# Patient Record
Sex: Female | Born: 1995 | Race: White | Hispanic: No | Marital: Single | Smoking: Never smoker
Health system: Southern US, Community
[De-identification: ages and names within clinical notes are randomized; demographics above are authoritative.]

## PROBLEM LIST (undated history)

## (undated) DIAGNOSIS — K5792 Diverticulitis of intestine, part unspecified, without perforation or abscess without bleeding: Secondary | ICD-10-CM

## (undated) HISTORY — PX: WISDOM TOOTH EXTRACTION: SHX21

## (undated) HISTORY — PX: TONSILLECTOMY: SUR1361

---

## 2017-03-17 ENCOUNTER — Emergency Department (HOSPITAL_COMMUNITY)
Admission: EM | Admit: 2017-03-17 | Discharge: 2017-03-17 | Disposition: A | Discharge status: Home / Self Care | Payer: Medicaid Other | Attending: Emergency Medicine | Admitting: Emergency Medicine

## 2017-03-17 ENCOUNTER — Emergency Department (HOSPITAL_COMMUNITY): Payer: Medicaid Other

## 2017-03-17 ENCOUNTER — Encounter (HOSPITAL_COMMUNITY): Payer: Self-pay | Admitting: *Deleted

## 2017-03-17 DIAGNOSIS — Z79899 Other long term (current) drug therapy: Secondary | ICD-10-CM | POA: Diagnosis not present

## 2017-03-17 DIAGNOSIS — R1032 Left lower quadrant pain: Secondary | ICD-10-CM | POA: Diagnosis present

## 2017-03-17 DIAGNOSIS — N12 Tubulo-interstitial nephritis, not specified as acute or chronic: Secondary | ICD-10-CM | POA: Diagnosis not present

## 2017-03-17 DIAGNOSIS — K922 Gastrointestinal hemorrhage, unspecified: Secondary | ICD-10-CM | POA: Diagnosis not present

## 2017-03-17 HISTORY — DX: Diverticulitis of intestine, part unspecified, without perforation or abscess without bleeding: K57.92

## 2017-03-17 LAB — COMPREHENSIVE METABOLIC PANEL
ALK PHOS: 72 U/L (ref 38–126)
ALT: 14 U/L (ref 14–54)
ANION GAP: 9 (ref 5–15)
AST: 27 U/L (ref 15–41)
Albumin: 4.9 g/dL (ref 3.5–5.0)
BILIRUBIN TOTAL: 0.9 mg/dL (ref 0.3–1.2)
BUN: 19 mg/dL (ref 6–20)
CALCIUM: 9.4 mg/dL (ref 8.9–10.3)
CO2: 26 mmol/L (ref 22–32)
CREATININE: 1.95 mg/dL — AB (ref 0.44–1.00)
Chloride: 103 mmol/L (ref 101–111)
GFR calc non Af Amer: 35 mL/min — ABNORMAL LOW (ref >60)
GFR, EST AFRICAN AMERICAN: 41 mL/min — AB (ref >60)
Glucose, Bld: 88 mg/dL (ref 65–99)
Potassium: 3.8 mmol/L (ref 3.5–5.1)
Sodium: 138 mmol/L (ref 135–145)
TOTAL PROTEIN: 8.3 g/dL — AB (ref 6.5–8.1)

## 2017-03-17 LAB — CBC
HCT: 41 % (ref 36.0–46.0)
HEMOGLOBIN: 13.9 g/dL (ref 12.0–15.0)
MCH: 29.1 pg (ref 26.0–34.0)
MCHC: 33.9 g/dL (ref 30.0–36.0)
MCV: 86 fL (ref 78.0–100.0)
PLATELETS: 311 10*3/uL (ref 150–400)
RBC: 4.77 MIL/uL (ref 3.87–5.11)
RDW: 13.2 % (ref 11.5–15.5)
WBC: 13.4 10*3/uL — ABNORMAL HIGH (ref 4.0–10.5)

## 2017-03-17 LAB — URINALYSIS, ROUTINE W REFLEX MICROSCOPIC
BILIRUBIN URINE: NEGATIVE
Glucose, UA: NEGATIVE mg/dL
Hgb urine dipstick: NEGATIVE
Ketones, ur: NEGATIVE mg/dL
Leukocytes, UA: NEGATIVE
NITRITE: NEGATIVE
PROTEIN: NEGATIVE mg/dL
SPECIFIC GRAVITY, URINE: 1.01 (ref 1.005–1.030)
pH: 5 (ref 5.0–8.0)

## 2017-03-17 LAB — PREGNANCY, URINE: PREG TEST UR: NEGATIVE

## 2017-03-17 LAB — LIPASE, BLOOD: Lipase: 22 U/L (ref 11–51)

## 2017-03-17 MED ORDER — SODIUM CHLORIDE 0.9 % IV BOLUS (SEPSIS)
500.0000 mL | Freq: Once | INTRAVENOUS | Status: AC
  Administered 2017-03-17: 500 mL via INTRAVENOUS

## 2017-03-17 MED ORDER — IOPAMIDOL (ISOVUE-300) INJECTION 61%
100.0000 mL | Freq: Once | INTRAVENOUS | Status: AC | PRN
  Administered 2017-03-17: 80 mL via INTRAVENOUS

## 2017-03-17 MED ORDER — ONDANSETRON HCL 4 MG/2ML IJ SOLN
4.0000 mg | Freq: Once | INTRAMUSCULAR | Status: AC
  Administered 2017-03-17: 4 mg via INTRAVENOUS
  Filled 2017-03-17: qty 2

## 2017-03-17 NOTE — Discharge Instructions (Signed)
You may have an interstitial nephritis. Stop the ciprofloxacin. After creatinine rechecked in a few days. If it is increasingly may need to see nephrology. Follow up with gastroenterology for the GI bleeding.

## 2017-03-17 NOTE — ED Triage Notes (Signed)
Patient is alert and oriented x4.  She is being seen for vomiting blood along with rectal bleeding.  Patient was seen two weeks ago and Dx with diverticulitis and as of today started vomiting blood.  Additionally the patient reports abdominal distension and lower back pain currently rates 6 of 10.

## 2017-03-17 NOTE — ED Provider Notes (Signed)
[facility]-EMERGENCY DEPT Provider Note   CSN: [id number] Arrival date & time: 03/17/17  1032     History   Chief Complaint Chief Complaint  Patient presents with  . Back Pain  . Hematemesis    HPI Mariah Christensen is a 21 y.o. female.  HPI Patient presents with lower abdominal and back pain. Set up for the last 2 weeks. Also blood in the stool. States she was seen at person [facility] around 2 weeks ago. States blood work was done and a rectal exam was done. States that rectal exam showed blood. States she was told to follow-up with her primary care doctor and may be GI. CT scan was done. States she followed up with her primary care doctor who told her that they should've been able to figure out what was going on with the blood work. States that she was told that she had diverticulitis and was started on an antibiotic. States she thinks it started with a see. She still been taking it. States that she's had continued bleeding and continued pain. Pain is in her low back and left lower abdomen. No vaginal bleeding or discharge. Stool is a little looser than normal but not diarrhea. Has had red blood with it. Also said 2 episodes of vomiting states was a little blood in the emesis. Patient said she has always had some problems with diarrhea. Past Medical History:  Diagnosis Date  . Diverticulitis     There are no active problems to display for this patient.   Past Surgical History:  Procedure Laterality Date  . WISDOM TOOTH EXTRACTION      OB History    No data available       Home Medications    Prior to Admission medications   Medication Sig Start Date End Date Taking? Authorizing Provider  amphetamine-dextroamphetamine (ADDERALL) 20 MG tablet Take 20 mg by mouth 2 (two) times daily. 01/25/17  Yes [provider]  escitalopram (LEXAPRO) 20 MG tablet Take 20 mg by mouth daily. 02/13/17  Yes [provider]  ibuprofen (ADVIL,MOTRIN) 200 MG tablet Take 200 mg by  mouth every 6 (six) hours as needed for moderate pain.   Yes [provider]  omeprazole (PRILOSEC) 20 MG capsule Take 20 mg by mouth daily. 02/13/17  Yes [provider]  temazepam (RESTORIL) 15 MG capsule take 1 capsule by mouth at bedtime if needed for sleep 12/28/16  Yes [provider]    Family History History reviewed. No pertinent family history.  Social History Social History  Substance Use Topics  . Smoking status: Never Smoker  . Smokeless tobacco: Never Used  . Alcohol use Yes     Allergies   Amoxapine and related   Review of Systems Review of Systems  Constitutional: Positive for appetite change.  HENT: Negative for congestion.   Respiratory: Negative for shortness of breath.   Cardiovascular: Negative for chest pain.  Gastrointestinal: Positive for abdominal pain, blood in stool, nausea and vomiting. Negative for abdominal distention.  Genitourinary: Negative for dysuria and flank pain.  Musculoskeletal: Positive for back pain.  Neurological: Negative for numbness.  Hematological: Negative for adenopathy.  Psychiatric/Behavioral: Negative for confusion.     Physical Exam Updated Vital Signs BP 124/81 Comment: Resp 16  Pulse 90 Comment: Resp 16  Temp 98.5 F (36.9 C) (Oral)   Resp 16   Ht  (1.499 m)   Wt 61.2 kg (135 lb)   LMP 02/14/2017 Comment: negative urine pregnancy  test 03-17-2017  SpO2 100% Comment: Resp 16  BMI 27.27 kg/m   Physical Exam  Constitutional: She appears well-developed.  HENT:  Head: Atraumatic.  Neck: Neck supple.  Cardiovascular: Normal rate.   Pulmonary/Chest: Effort normal.  Abdominal: There is tenderness.  Mild suprapubic to left lower quadrant tenderness. No rebound or guarding.  Musculoskeletal: She exhibits no edema.  Neurological: She is alert.  Skin: Capillary refill takes less than 2 seconds.  Psychiatric: She has a normal mood and affect.     ED Treatments / Results  Labs (all  labs ordered are listed, but only abnormal results are displayed) Labs Reviewed  COMPREHENSIVE METABOLIC PANEL - Abnormal; Notable for the following:       Result Value   Creatinine, Ser 1.95 (*)    Total Protein 8.3 (*)    GFR calc non Af Amer 35 (*)    GFR calc Af Amer 41 (*)    All other components within normal limits  CBC - Abnormal; Notable for the following:    WBC 13.4 (*)    All other components within normal limits  URINALYSIS, ROUTINE W REFLEX MICROSCOPIC - Abnormal; Notable for the following:    Color, Urine STRAW (*)    All other components within normal limits  LIPASE, BLOOD  PREGNANCY, URINE    EKG  EKG Interpretation None       Radiology Ct Abdomen Pelvis W Contrast  Result Date: 03/17/2017 CLINICAL DATA:  Low back pain vomiting and bloody stool EXAM: CT ABDOMEN AND PELVIS WITH CONTRAST TECHNIQUE: Multidetector CT imaging of the abdomen and pelvis was performed using the standard protocol following bolus administration of intravenous contrast. CONTRAST:  80mL ISOVUE-300 IOPAMIDOL (ISOVUE-300) INJECTION 61% COMPARISON:  None. FINDINGS: Lower chest: No acute abnormality. Hepatobiliary: No focal liver abnormality is seen. No gallstones, gallbladder wall thickening, or biliary dilatation. Pancreas: Unremarkable. No pancreatic ductal dilatation or surrounding inflammatory changes. Spleen: Normal in size without focal abnormality. Adrenals/Urinary Tract: Adrenal glands are within normal limits. Diffuse multifocal hypodensity within the bilateral kidneys, left greater than right with hypoenhancement. Mild perinephric edema. No perinephric fluid collections. Bladder unremarkable. Stomach/Bowel: Stomach is within normal limits. Appendix appears normal. No evidence of bowel wall thickening, distention, or inflammatory changes. Vascular/Lymphatic: No significant vascular findings are present. No enlarged abdominal or pelvic lymph nodes. Renal veins are patent bilaterally.  Reproductive: Uterus and bilateral adnexa are unremarkable. Other: No abdominal wall hernia or abnormality. No abdominopelvic ascites. Musculoskeletal: No acute or significant osseous findings. IMPRESSION: 1. Multifocal hypodensities/areas of hypoenhancement within the bilateral kidneys, left greater than right. Differential considerations include acute pyelonephritis (favored diagnosis) and acute interstitial nephritis (drug-induced, autoimmmune). Hypodensities do not appear masslike as may be seen with lymphoma. Correlation with urinalysis recommended. 2. There are no other acute abnormalities visualized. Electronically Signed   By: Jasmine Pang M.D.   On: 03/17/2017 14:38    Procedures Procedures (including critical care time)  Medications Ordered in ED Medications  sodium chloride 0.9 % bolus 500 mL (0 mLs Intravenous Stopped 03/17/17 1347)  ondansetron (ZOFRAN) injection 4 mg (4 mg Intravenous Given 03/17/17 1317)  sodium chloride 0.9 % bolus 500 mL (0 mLs Intravenous Stopped 03/17/17 1440)  iopamidol (ISOVUE-300) 61 % injection 100 mL (80 mLs Intravenous Contrast Given 03/17/17 1408)     Initial Impression / Assessment and Plan / ED Course  I have reviewed the triage vital signs and the nursing notes.  Pertinent labs & imaging results that were available during my care  of the patient were reviewed by me and considered in my medical decision making (see chart for details).     Patient presents with GI bleeding. Also some dull lower abdominal pain. Has been seen for same and diagnosed with diverticulitis and started on Cipro. However her creatinine is increased. CT scan shows potential interstitial nephritis. Otherwise no bowel findings. Could be related to the Cipro. Discussed with Dr. Arlean Hopping from nephrology, who reviewed the chart and CT scan including discussion with radiology. Will need repeat creatinine early next week. If improving likely does not need more workup. Patient is to avoid  the Cipro. If increasing more may need to see nephrology. Will discharge home. GI consult follow-up the rectal bleeding. Hemoglobin is stable.  Final Clinical Impressions(s) / ED Diagnoses   Final diagnoses:  Interstitial nephritis  Gastrointestinal hemorrhage, unspecified gastrointestinal hemorrhage type    New Prescriptions Current Discharge Medication List       Benjiman Core, MD 03/17/17 1529

## 2018-08-03 ENCOUNTER — Other Ambulatory Visit: Payer: Self-pay

## 2018-08-03 ENCOUNTER — Emergency Department (HOSPITAL_COMMUNITY)
Admission: EM | Admit: 2018-08-03 | Discharge: 2018-08-04 | Disposition: A | Discharge status: Home / Self Care | Payer: Medicaid Other | Attending: Emergency Medicine | Admitting: Emergency Medicine

## 2018-08-03 ENCOUNTER — Encounter (HOSPITAL_COMMUNITY): Payer: Self-pay

## 2018-08-03 DIAGNOSIS — R102 Pelvic and perineal pain: Secondary | ICD-10-CM | POA: Insufficient documentation

## 2018-08-03 DIAGNOSIS — O218 Other vomiting complicating pregnancy: Secondary | ICD-10-CM | POA: Diagnosis not present

## 2018-08-03 DIAGNOSIS — O219 Vomiting of pregnancy, unspecified: Secondary | ICD-10-CM

## 2018-08-03 DIAGNOSIS — Z3A01 Less than 8 weeks gestation of pregnancy: Secondary | ICD-10-CM | POA: Diagnosis not present

## 2018-08-03 DIAGNOSIS — Z79899 Other long term (current) drug therapy: Secondary | ICD-10-CM | POA: Insufficient documentation

## 2018-08-03 DIAGNOSIS — O9989 Other specified diseases and conditions complicating pregnancy, childbirth and the puerperium: Secondary | ICD-10-CM | POA: Diagnosis present

## 2018-08-03 LAB — COMPREHENSIVE METABOLIC PANEL
ALBUMIN: 5.3 g/dL — AB (ref 3.5–5.0)
ALK PHOS: 68 U/L (ref 38–126)
ALT: 16 U/L (ref 0–44)
ANION GAP: 10 (ref 5–15)
AST: 24 U/L (ref 15–41)
BILIRUBIN TOTAL: 0.9 mg/dL (ref 0.3–1.2)
BUN: 10 mg/dL (ref 6–20)
CALCIUM: 9.7 mg/dL (ref 8.9–10.3)
CO2: 23 mmol/L (ref 22–32)
Chloride: 105 mmol/L (ref 98–111)
Creatinine, Ser: 0.61 mg/dL (ref 0.44–1.00)
GFR calc non Af Amer: >60 mL/min (ref >60)
Glucose, Bld: 83 mg/dL (ref 70–99)
POTASSIUM: 3.2 mmol/L — AB (ref 3.5–5.1)
SODIUM: 138 mmol/L (ref 135–145)
TOTAL PROTEIN: 8.8 g/dL — AB (ref 6.5–8.1)

## 2018-08-03 LAB — URINALYSIS, ROUTINE W REFLEX MICROSCOPIC
Bilirubin Urine: NEGATIVE
Glucose, UA: NEGATIVE mg/dL
HGB URINE DIPSTICK: NEGATIVE
Ketones, ur: 80 mg/dL — AB
LEUKOCYTE UA: NEGATIVE
NITRITE: NEGATIVE
PROTEIN: NEGATIVE mg/dL
SPECIFIC GRAVITY, URINE: 1.027 (ref 1.005–1.030)
pH: 5 (ref 5.0–8.0)

## 2018-08-03 LAB — CBC
HCT: 44 % (ref 36.0–46.0)
HEMOGLOBIN: 14 g/dL (ref 12.0–15.0)
MCH: 29.7 pg (ref 26.0–34.0)
MCHC: 31.8 g/dL (ref 30.0–36.0)
MCV: 93.4 fL (ref 80.0–100.0)
NRBC: 0 % (ref 0.0–0.2)
Platelets: 378 10*3/uL (ref 150–400)
RBC: 4.71 MIL/uL (ref 3.87–5.11)
RDW: 13.2 % (ref 11.5–15.5)
WBC: 13.3 10*3/uL — AB (ref 4.0–10.5)

## 2018-08-03 LAB — LIPASE, BLOOD: Lipase: 30 U/L (ref 11–51)

## 2018-08-03 LAB — I-STAT BETA HCG BLOOD, ED (MC, WL, AP ONLY)

## 2018-08-03 MED ORDER — LACTATED RINGERS IV BOLUS
1000.0000 mL | Freq: Once | INTRAVENOUS | Status: AC
  Administered 2018-08-04: 1000 mL via INTRAVENOUS

## 2018-08-03 MED ORDER — ONDANSETRON HCL 4 MG/2ML IJ SOLN
4.0000 mg | Freq: Once | INTRAMUSCULAR | Status: AC
  Administered 2018-08-04: 4 mg via INTRAVENOUS
  Filled 2018-08-03: qty 2

## 2018-08-03 MED ORDER — SODIUM CHLORIDE 0.9% FLUSH
3.0000 mL | Freq: Once | INTRAVENOUS | Status: AC
  Administered 2018-08-04: 3 mL via INTRAVENOUS

## 2018-08-03 NOTE — ED Provider Notes (Signed)
[facility]-EMERGENCY DEPT Provider Note   CSN: [id number] Arrival date & time: 08/03/18  1548    History   Chief Complaint Chief Complaint  Patient presents with  . [redacted] weeks pregnant  . Emesis    HPI Mariah Christensen is a 23 y.o. female.     The history is provided by the patient and medical records.     23 y.o. G1 P0  approx [redacted] weeks pregnant here with nausea/vomiting.  States she has been nauseous since she found out she was pregnant but has started vomiting over the past 3 days or so.  States she tries to eat and drink but as soon as she swallows food or drink it comes right back up.  She is not had any diarrhea.  She does report some lower abdominal cramping but denies any loss of fluid, irregular bleeding, or vaginal discharge.  No fever or chills.  No urinary symptoms.  Dates she is waiting for her to be switched over so she can establish care with OB/GYN.  Her PCP did prescribe her Dramamine a few days ago which is not providing any relief of vomiting.  Past Medical History:  Diagnosis Date  . Diverticulitis     There are no active problems to display for this patient.   Past Surgical History:  Procedure Laterality Date  . TONSILLECTOMY    . WISDOM TOOTH EXTRACTION       OB History    Gravida  1   Para      Term      Preterm      AB      Living        SAB      TAB      Ectopic      Multiple      Live Births               Home Medications    Prior to Admission medications   Medication Sig Start Date End Date Taking? Authorizing Provider  acetaminophen (TYLENOL) 500 MG tablet Take 1,000 mg by mouth daily as needed for moderate pain.   Yes [provider]  dimenhyDRINATE (DRAMAMINE) 50 MG tablet Take 50 mg by mouth daily as needed for nausea (vomiting).   Yes [provider]  omeprazole (PRILOSEC) 40 MG capsule Take 40 mg by mouth daily. 07/05/18  Yes [provider]  Prenatal Vit-Fe Fumarate-FA  (PRENATAL PO) Take by mouth.   Yes [provider]  amphetamine-dextroamphetamine (ADDERALL) 20 MG tablet Take 20 mg by mouth 2 (two) times daily. 01/25/17   [provider]  escitalopram (LEXAPRO) 20 MG tablet Take 20 mg by mouth daily. 02/13/17   [provider]    Family History Family History  Problem Relation Age of Onset  . Heart failure Mother   . Hypertension Mother   . Cancer Father   . Heart failure Father   . Eclampsia Sister     Social History Social History   Tobacco Use  . Smoking status: Never Smoker  . Smokeless tobacco: Never Used  Substance Use Topics  . Alcohol use: Yes  . Drug use: No     Allergies   Amoxapine and related   Review of Systems Review of Systems  Gastrointestinal: Positive for nausea and vomiting.  All other systems reviewed and are negative.    Physical Exam Updated Vital Signs BP 135/72 (BP Location: Right Arm)   Pulse 83   Temp  98.9 F (37.2 C) (Oral)   Resp 18   Ht 4\' 11"  (1.499 m)   Wt 70.3 kg   LMP 06/22/2018   SpO2 100%   BMI 31.31 kg/m   Physical Exam Vitals signs and nursing note reviewed.  Constitutional:      Appearance: She is well-developed.  HENT:     Head: Normocephalic and atraumatic.     Mouth/Throat:     Comments: Dry mucous membranes Eyes:     Conjunctiva/sclera: Conjunctivae normal.     Pupils: Pupils are equal, round, and reactive to light.  Neck:     Musculoskeletal: Normal range of motion.  Cardiovascular:     Rate and Rhythm: Normal rate and regular rhythm.     Heart sounds: Normal heart sounds.  Pulmonary:     Effort: Pulmonary effort is normal.     Breath sounds: Normal breath sounds.  Abdominal:     General: Bowel sounds are normal.     Palpations: Abdomen is soft.     Comments: Soft, non-tender  Musculoskeletal: Normal range of motion.  Skin:    General: Skin is warm and dry.  Neurological:     Mental Status: She is alert and oriented to person, place,  and time.      ED Treatments / Results  Labs (all labs ordered are listed, but only abnormal results are displayed) Labs Reviewed  COMPREHENSIVE METABOLIC PANEL - Abnormal; Notable for the following components:      Result Value   Potassium 3.2 (*)    Total Protein 8.8 (*)    Albumin 5.3 (*)    All other components within normal limits  CBC - Abnormal; Notable for the following components:   WBC 13.3 (*)    All other components within normal limits  URINALYSIS, ROUTINE W REFLEX MICROSCOPIC - Abnormal; Notable for the following components:   APPearance HAZY (*)    Ketones, ur 80 (*)    All other components within normal limits  HCG, QUANTITATIVE, PREGNANCY - Abnormal; Notable for the following components:   hCG, Beta Chain, Quant, S 52,940 (*)    All other components within normal limits  I-STAT BETA HCG BLOOD, ED ([facility], [facility], [facility] ONLY) - Abnormal; Notable for the following components:   I-stat hCG, quantitative >2,000.0 (*)    All other components within normal limits  LIPASE, BLOOD    EKG None  Radiology No results found.  Procedures Procedures (including critical care time)  Medications Ordered in ED Medications  sodium chloride flush (NS) 0.9 % injection 3 mL (3 mLs Intravenous Given 08/04/18 0203)  lactated ringers bolus 1,000 mL (1,000 mLs Intravenous New Bag/Given 08/04/18 0201)  ondansetron (ZOFRAN) injection 4 mg (4 mg Intravenous Given 08/04/18 0202)  lactated ringers bolus 1,000 mL (1,000 mLs Intravenous New Bag/Given 08/04/18 0245)     Initial Impression / Assessment and Plan / ED Course  I have reviewed the triage vital signs and the nursing notes.  Pertinent labs & imaging results that were available during my care of the patient were reviewed by me and considered in my medical decision making (see chart for details).  23 y.o. G1P0 approx [redacted] weeks gestation here with nausea/vomiting.  Was prescirbed dramamine by PCP without relief.  No vaginal bleeding, loss  of fluid, pelvic pain, or vaginal discharge.  VSS.  Does appear clinically dry.  Labs with findings of dehydration, otherwise reassuring.  Plan for IVF, zofran.  4:21 AM Patient has had 2 L of lactated  Ringer's and single dose of Zofran.  She reports feeling significantly better.  She has not had any active emesis here in the ED.  Has tolerated full cup of ice chips without issue.   Plan to discharge home with symptomatic care.  Prescribed diclegis for home.  Close follow-up with OB-GYN-- given information for women's clinic as she is not yet established.  Return here for any new/acute changes.    Final Clinical Impressions(s) / ED Diagnoses   Final diagnoses:  Nausea and vomiting in pregnancy    ED Discharge Orders         Ordered    Doxylamine-Pyridoxine 10-10 MG TBEC  Daily at bedtime     08/04/18 0423           Garlon HatchetSanders, Kassidee Narciso M, PA-C 08/04/18 0441    Zadie RhineWickline, Donald, MD 08/04/18 [phone]

## 2018-08-03 NOTE — ED Triage Notes (Signed)
Patient reports that she is [redacted] weeks pregnant. Patient states she has had nauses 5 days and vomiting x 3 days. Patient states she was given dramamine by her PCP 45 days ago from her PCP.

## 2018-08-04 LAB — HCG, QUANTITATIVE, PREGNANCY: hCG, Beta Chain, Quant, S: 52940 m[IU]/mL — ABNORMAL HIGH (ref <5)

## 2018-08-04 MED ORDER — DOXYLAMINE-PYRIDOXINE 10-10 MG PO TBEC: 1.0000 | DELAYED_RELEASE_TABLET | Freq: Every day | ORAL | 0 refills | Status: AC

## 2018-08-04 MED ORDER — LACTATED RINGERS IV BOLUS
1000.0000 mL | Freq: Once | INTRAVENOUS | Status: AC
  Administered 2018-08-04: 1000 mL via INTRAVENOUS

## 2018-08-04 NOTE — Discharge Instructions (Signed)
Try to continue drinking fluids-- gatorade, powerade, smart water, etc all have electrolytes to keep hydrated. Ginger can naturally help with nausea, may want to try that. Try the diclegis to help with nausea.  If this is too expensive, can try unisom and B6 mixed together (pharmacist can help with this if needed). Follow-up with OB-- can use women's clinic for now. Return to the ED for new or worsening symptoms.

## 2018-11-04 IMAGING — CT CT ABD-PELV W/ CM
2 of 4 series · 16 of 46 positions shown, 18 images · IV contrast (ISOVUE)
Comparison: None.

CLINICAL DATA: Low back pain vomiting and bloody stool

EXAM:
CT ABDOMEN AND PELVIS WITH CONTRAST
TECHNIQUE: Multidetector CT imaging of the abdomen and pelvis was performed
using the standard protocol following bolus administration of
intravenous contrast.
CONTRAST:  80mL TI3CH6-D00 IOPAMIDOL (TI3CH6-D00) INJECTION 61%

[Series 2: abd/pel with · axial · 0.60mm/px · z∈[-412,-47]mm · 13 of 83 slices shown, 15 images]
[im 5/83  soft-tissue]
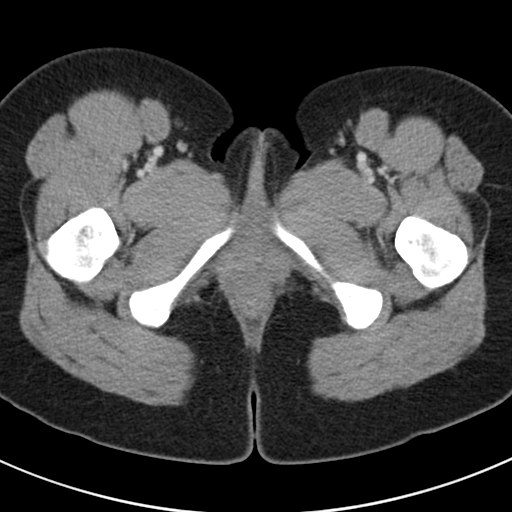
[im 5/83  bone]
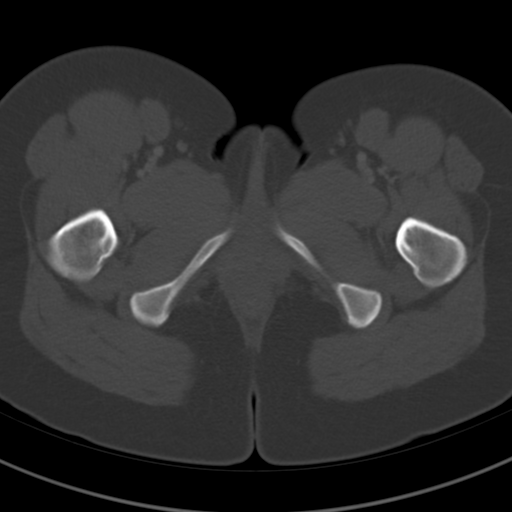
[im 10/83  soft-tissue]
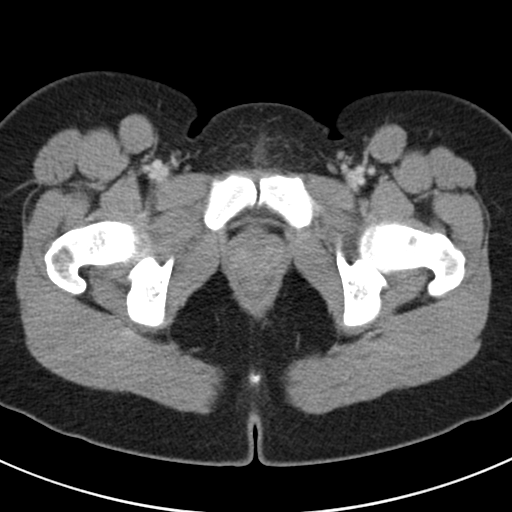
[im 19/83  soft-tissue]
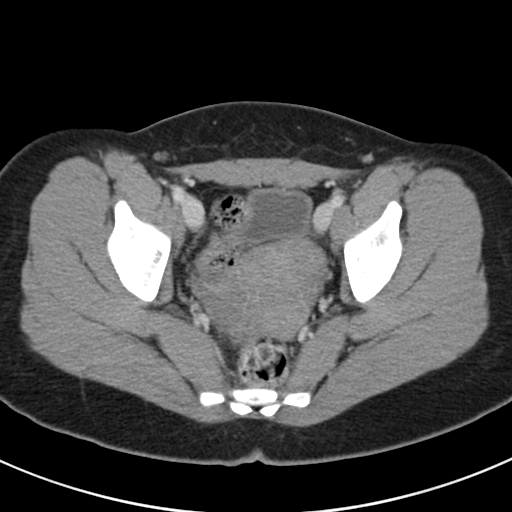
[im 23/83  soft-tissue]
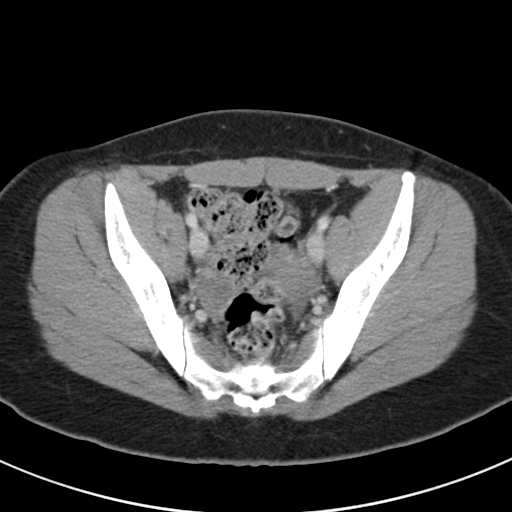
[im 28/83  soft-tissue]
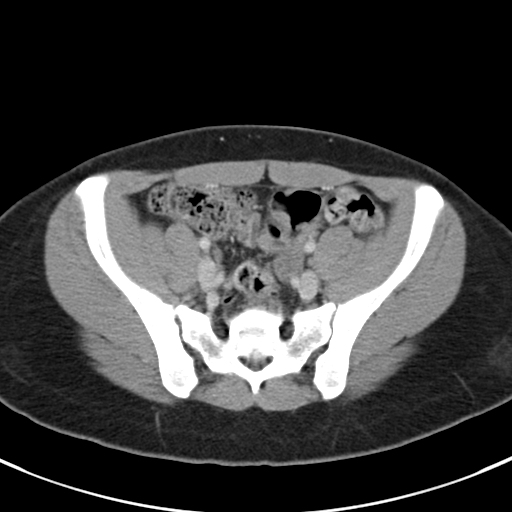
[im 37/83  soft-tissue]
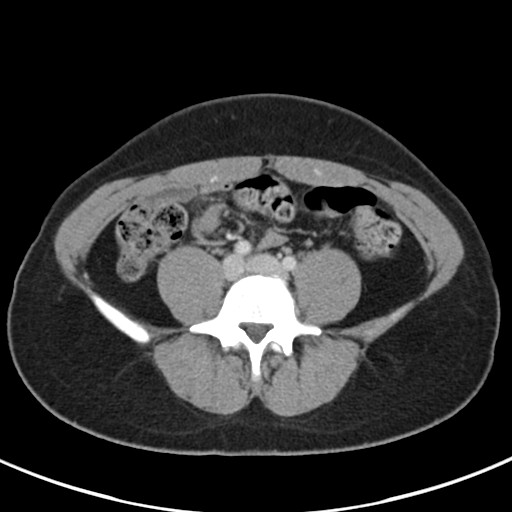
[im 42/83  soft-tissue]
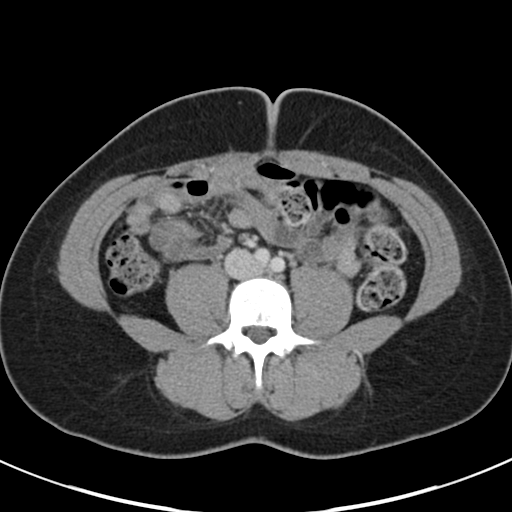
[im 46/83  soft-tissue]
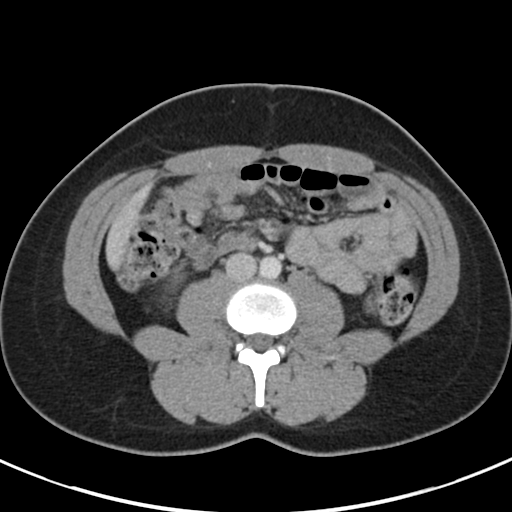
[im 55/83  soft-tissue]
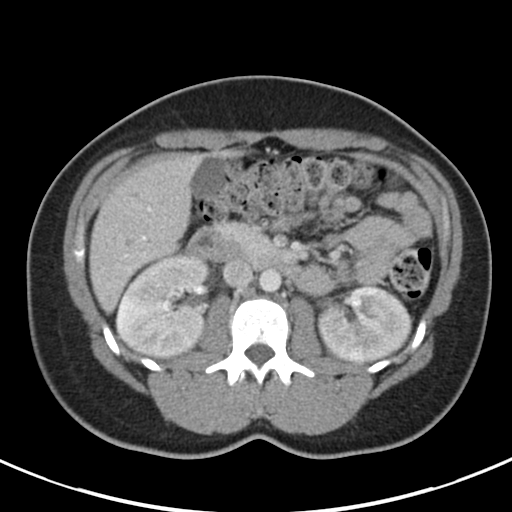
[im 55/83  bone]
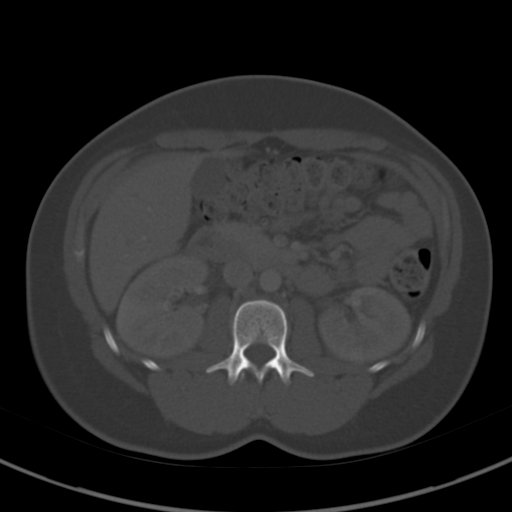
[im 60/83  soft-tissue]
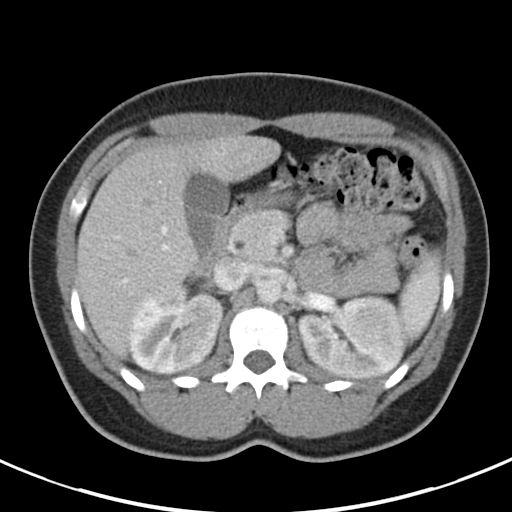
[im 64/83  soft-tissue]
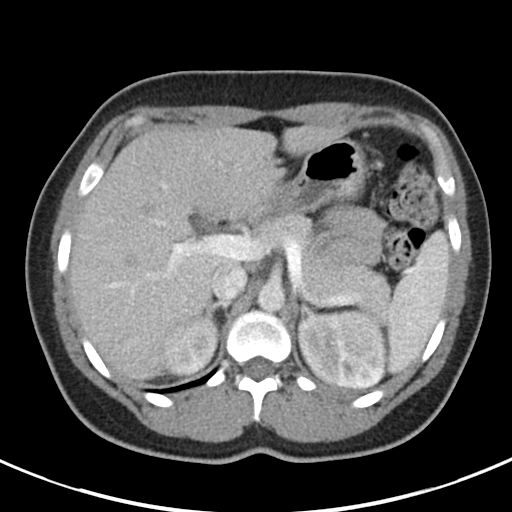
[im 73/83  soft-tissue]
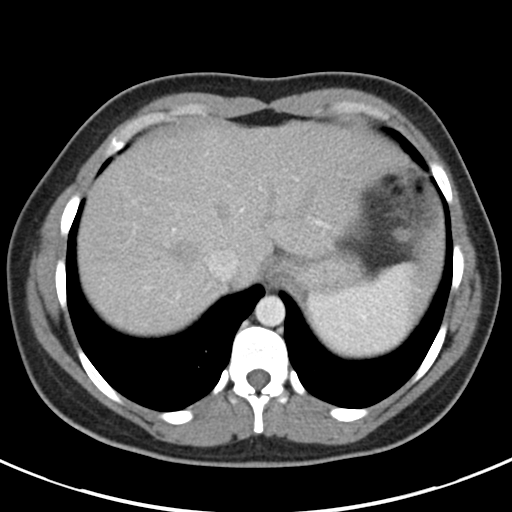
[im 78/83  soft-tissue]
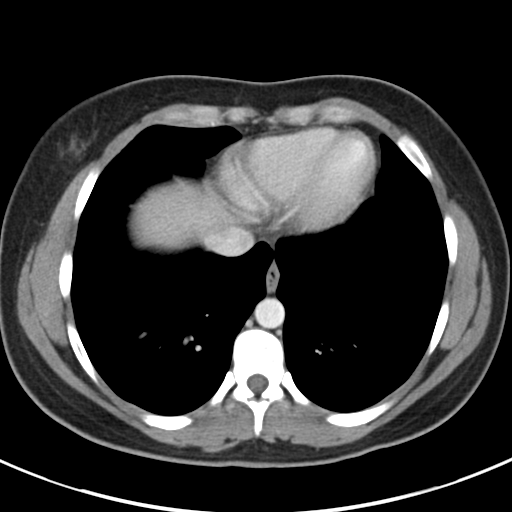

[Series 5: coronal a/|p · coronal · 0.68mm/px · 3 of 137 slices shown]
[im 46/137  soft-tissue]
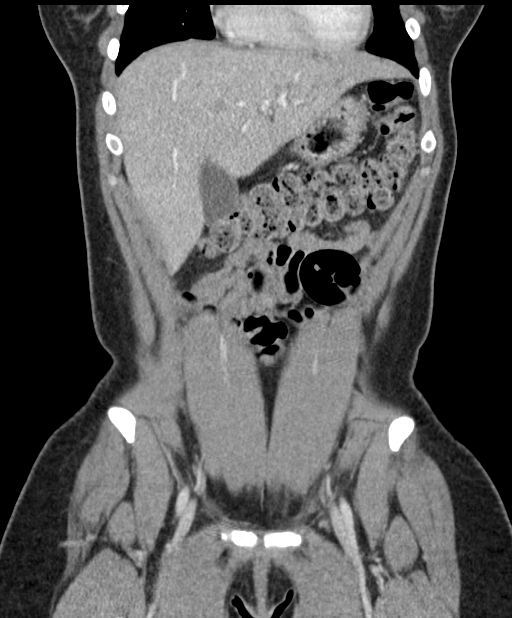
[im 61/137  soft-tissue]
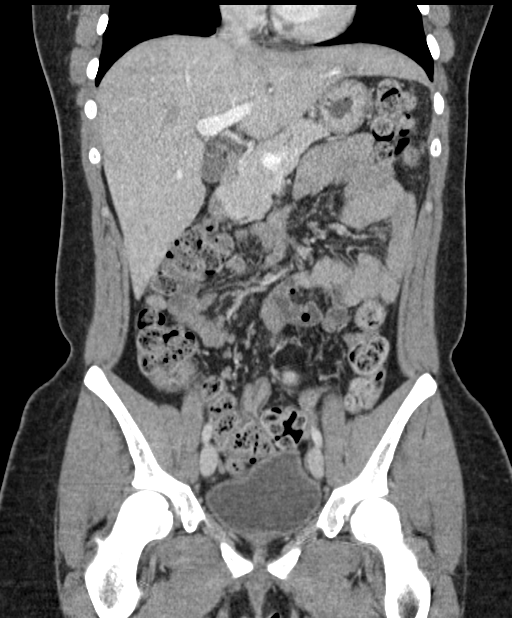
[im 76/137  soft-tissue]
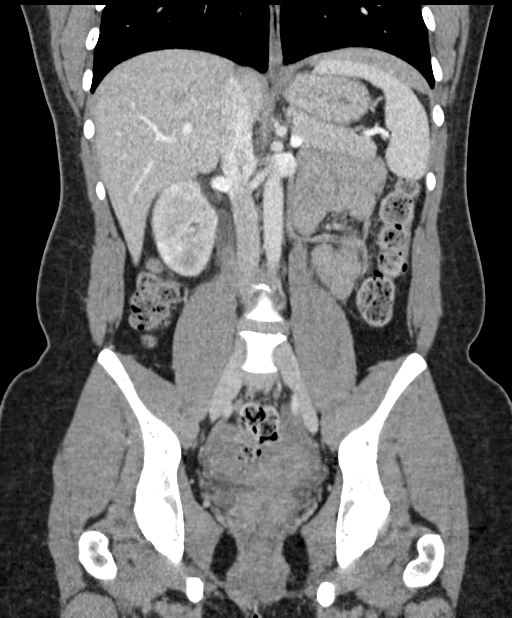

[16 of 46 positions shown; findings below may reference images not displayed]

FINDINGS: Lower chest: No acute abnormality.

Hepatobiliary: No focal liver abnormality is seen. No gallstones,
gallbladder wall thickening, or biliary dilatation.

Pancreas: Unremarkable. No pancreatic ductal dilatation or
surrounding inflammatory changes.

Spleen: Normal in size without focal abnormality.

Adrenals/Urinary Tract: Adrenal glands are within normal limits.
Diffuse multifocal hypodensity within the bilateral kidneys, left
greater than right with hypoenhancement. Mild perinephric edema. No
perinephric fluid collections. Bladder unremarkable.

Stomach/Bowel: Stomach is within normal limits. Appendix appears
normal. No evidence of bowel wall thickening, distention, or
inflammatory changes.

Vascular/Lymphatic: No significant vascular findings are present. No
enlarged abdominal or pelvic lymph nodes. Renal veins are patent
bilaterally.

Reproductive: Uterus and bilateral adnexa are unremarkable.

Other: No abdominal wall hernia or abnormality. No abdominopelvic
ascites.

Musculoskeletal: No acute or significant osseous findings.
IMPRESSION: 1. Multifocal hypodensities/areas of hypoenhancement within the
bilateral kidneys, left greater than right. Differential
considerations include acute pyelonephritis (favored diagnosis) and
acute interstitial nephritis (drug-induced, autoimmmune).
Hypodensities do not appear masslike as may be seen with lymphoma.
Correlation with urinalysis recommended.
2. There are no other acute abnormalities visualized.
